# Patient Record
Sex: Female | Born: 1955 | Race: Black or African American | Hispanic: No | Marital: Married
Health system: Southern US, Community
[De-identification: ages and names within clinical notes are randomized; demographics above are authoritative.]

---

## 2004-07-14 ENCOUNTER — Inpatient Hospital Stay: Payer: Self-pay | Admitting: Unknown Physician Specialty

## 2004-11-21 ENCOUNTER — Emergency Department: Payer: Self-pay | Admitting: Emergency Medicine

## 2005-10-18 ENCOUNTER — Other Ambulatory Visit: Payer: Self-pay

## 2005-10-18 ENCOUNTER — Emergency Department: Payer: Self-pay | Admitting: Emergency Medicine

## 2006-10-02 ENCOUNTER — Ambulatory Visit: Payer: Self-pay | Admitting: Cardiovascular Disease

## 2007-08-04 ENCOUNTER — Emergency Department: Payer: Self-pay | Admitting: Unknown Physician Specialty

## 2007-08-10 ENCOUNTER — Ambulatory Visit: Payer: Self-pay | Admitting: Family Medicine

## 2007-08-20 ENCOUNTER — Ambulatory Visit: Payer: Self-pay | Admitting: Family Medicine

## 2007-09-26 ENCOUNTER — Ambulatory Visit: Payer: Self-pay | Admitting: Internal Medicine

## 2007-09-27 ENCOUNTER — Emergency Department: Payer: Self-pay | Admitting: Emergency Medicine

## 2007-11-19 ENCOUNTER — Ambulatory Visit: Payer: Self-pay | Admitting: Gastroenterology

## 2007-12-18 ENCOUNTER — Ambulatory Visit: Payer: Self-pay | Admitting: Gastroenterology

## 2009-02-08 ENCOUNTER — Ambulatory Visit: Payer: Self-pay | Admitting: Family Medicine

## 2009-11-10 ENCOUNTER — Ambulatory Visit: Payer: Self-pay | Admitting: Family Medicine

## 2010-01-24 ENCOUNTER — Ambulatory Visit: Payer: Self-pay | Admitting: Oncology

## 2010-01-27 ENCOUNTER — Ambulatory Visit: Payer: Self-pay | Admitting: Oncology

## 2010-02-24 ENCOUNTER — Ambulatory Visit: Payer: Self-pay | Admitting: Oncology

## 2011-01-22 ENCOUNTER — Emergency Department: Payer: Self-pay | Admitting: Internal Medicine

## 2011-02-06 ENCOUNTER — Ambulatory Visit: Payer: Self-pay | Admitting: Oncology

## 2011-02-17 LAB — CANCER ANTIGEN 27.29: CA 27.29: 29.3 U/mL (ref 0.0–38.6)

## 2011-02-25 ENCOUNTER — Ambulatory Visit: Payer: Self-pay | Admitting: Oncology

## 2011-03-27 ENCOUNTER — Ambulatory Visit: Payer: Self-pay | Admitting: Oncology

## 2011-06-18 ENCOUNTER — Emergency Department: Payer: Self-pay | Admitting: Emergency Medicine

## 2011-08-29 ENCOUNTER — Ambulatory Visit: Payer: Self-pay | Admitting: Oncology

## 2011-08-29 LAB — CBC CANCER CENTER
Basophil #: 0.2 x10 3/mm — ABNORMAL HIGH (ref 0.0–0.1)
Basophil %: 4 %
Eosinophil #: 0.1 x10 3/mm (ref 0.0–0.7)
HCT: 42.3 % (ref 35.0–47.0)
HGB: 14.3 g/dL (ref 12.0–16.0)
Lymphocyte #: 2 x10 3/mm (ref 1.0–3.6)
Lymphocyte %: 37.1 %
MCHC: 33.7 g/dL (ref 32.0–36.0)
MCV: 94.1 fL (ref 80–100)
Monocyte %: 8.1 %
Neutrophil #: 2.7 x10 3/mm (ref 1.4–6.5)
WBC: 5.4 x10 3/mm (ref 3.6–11.0)

## 2011-08-29 LAB — BASIC METABOLIC PANEL
Anion Gap: 7 (ref 7–16)
Chloride: 103 mmol/L (ref 98–107)
Co2: 31 mmol/L (ref 21–32)
Creatinine: 1.34 mg/dL — ABNORMAL HIGH (ref 0.60–1.30)
EGFR (African American): 53 — ABNORMAL LOW
EGFR (Non-African Amer.): 43 — ABNORMAL LOW
Osmolality: 282 (ref 275–301)
Sodium: 141 mmol/L (ref 136–145)

## 2011-08-30 LAB — URINE IEP, RANDOM

## 2011-08-30 LAB — KAPPA/LAMBDA FREE LIGHT CHAINS (ARMC)

## 2011-08-30 LAB — PROT IMMUNOELECTROPHORES(ARMC)

## 2011-09-25 ENCOUNTER — Ambulatory Visit: Payer: Self-pay | Admitting: Oncology

## 2011-12-14 ENCOUNTER — Ambulatory Visit: Payer: Self-pay

## 2012-01-04 ENCOUNTER — Emergency Department: Payer: Self-pay | Admitting: Emergency Medicine

## 2012-01-04 LAB — BASIC METABOLIC PANEL
Anion Gap: 8 (ref 7–16)
Calcium, Total: 9.7 mg/dL (ref 8.5–10.1)
Co2: 26 mmol/L (ref 21–32)
Creatinine: 1.35 mg/dL — ABNORMAL HIGH (ref 0.60–1.30)
EGFR (African American): 50 — ABNORMAL LOW
EGFR (Non-African Amer.): 43 — ABNORMAL LOW
Glucose: 90 mg/dL (ref 65–99)
Osmolality: 279 (ref 275–301)
Potassium: 4.3 mmol/L (ref 3.5–5.1)
Sodium: 140 mmol/L (ref 136–145)

## 2012-01-06 ENCOUNTER — Ambulatory Visit: Payer: Self-pay | Admitting: Physician Assistant

## 2012-03-08 ENCOUNTER — Ambulatory Visit: Payer: Self-pay | Admitting: Internal Medicine

## 2012-04-15 ENCOUNTER — Ambulatory Visit: Payer: Self-pay

## 2012-05-04 ENCOUNTER — Ambulatory Visit: Payer: Self-pay

## 2012-06-18 IMAGING — CT CT CHEST-ABD-PELV W/ CM
2 of 3 series · 13 of 32 positions shown, 19 images · non-contrast
Comparison: none

REASON FOR EXAM: abn lesions in skull assess for primary
COMMENTS:

[Series 2: soft tissue · axial · 0.80mm/px · z∈[+6,+81]mm · 3 of 53 slices shown (1 of 2)]
[im 8/53  soft-tissue]
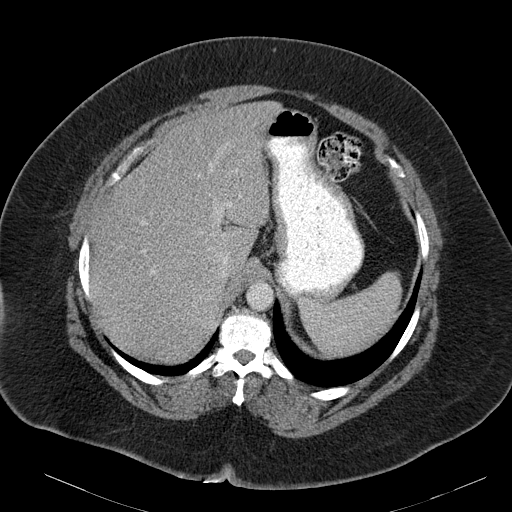
[im 15/53  soft-tissue]
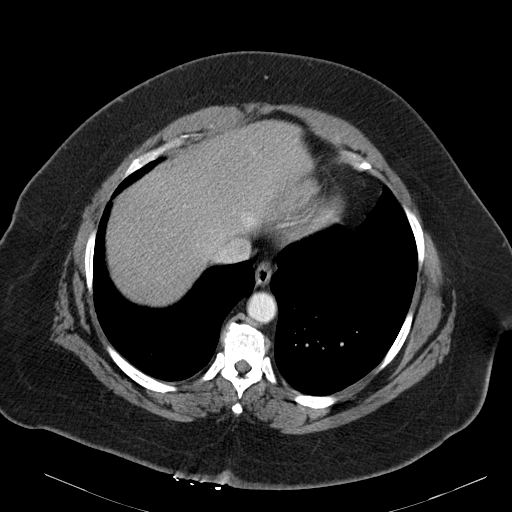
[im 23/53  soft-tissue]
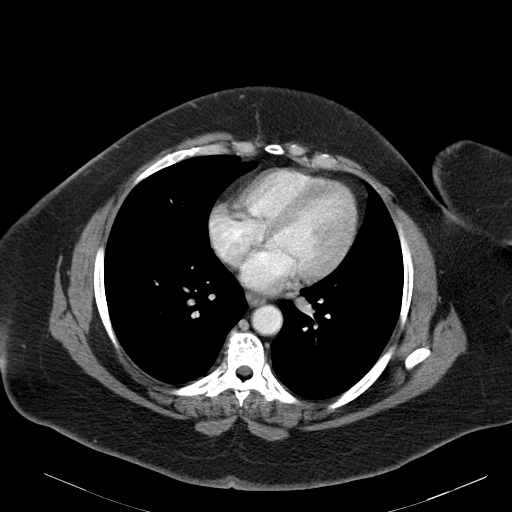

[Series 3: soft tissue · axial · 0.80mm/px · z∈[-314,+41]mm · 10 of 87 slices shown, 16 images (2 of 2)]
[im 8/87  soft-tissue]
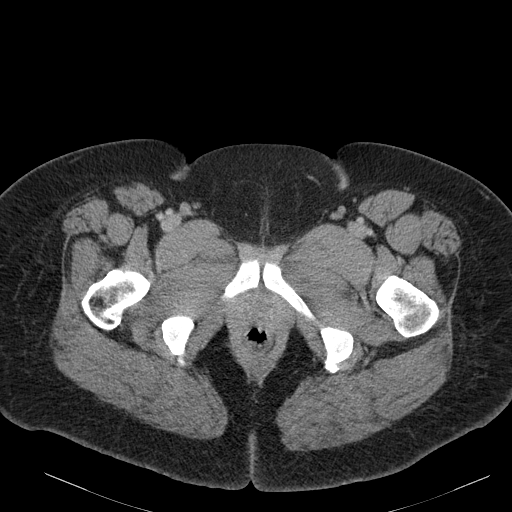
[im 8/87  bone]
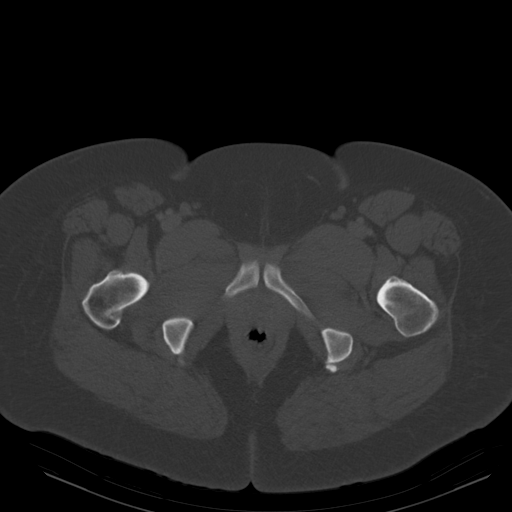
[im 16/87  soft-tissue]
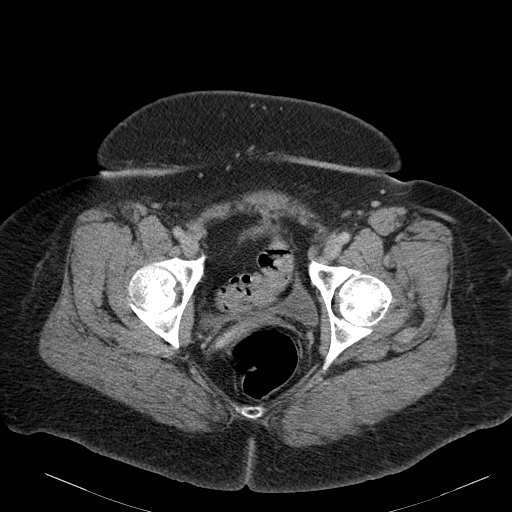
[im 24/87  soft-tissue]
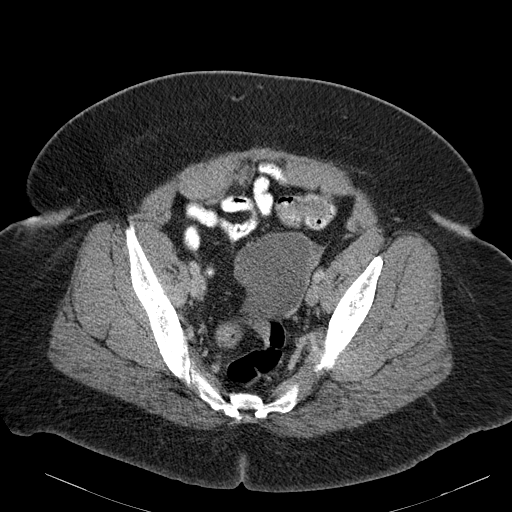
[im 32/87  soft-tissue]
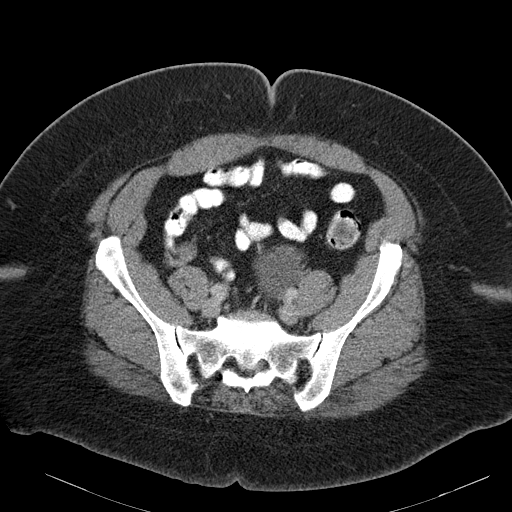
[im 40/87  soft-tissue]
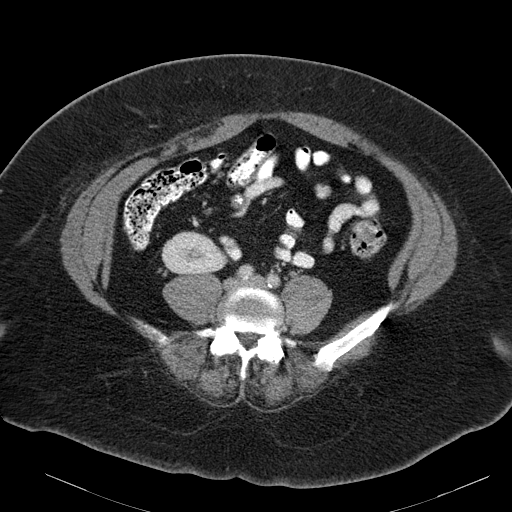
[im 47/87  soft-tissue]
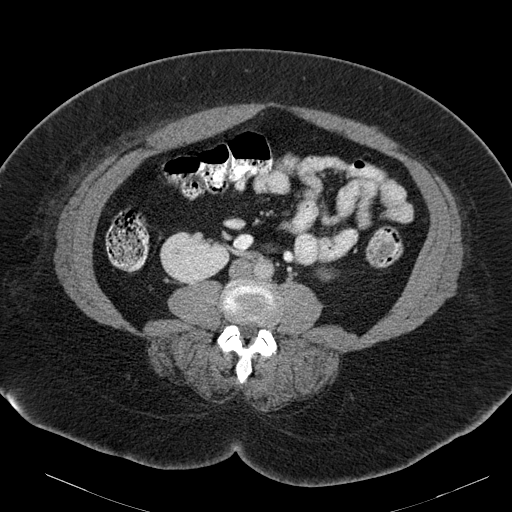
[im 55/87  soft-tissue]
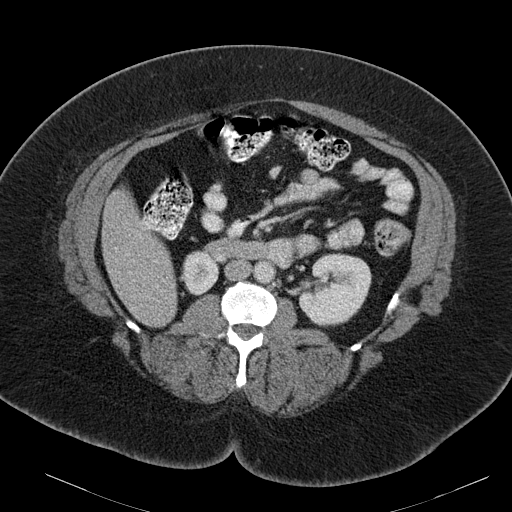
[im 55/87  lung]
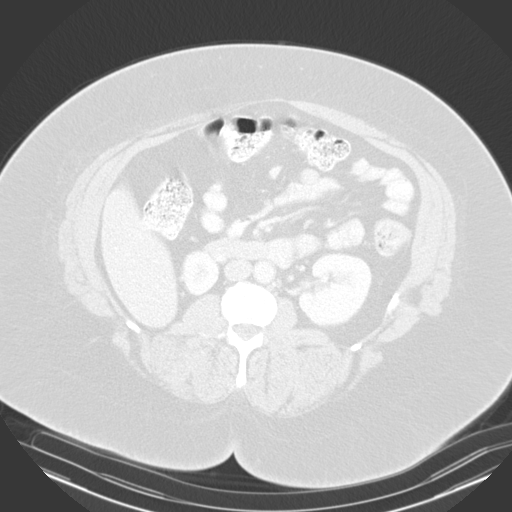
[im 63/87  soft-tissue]
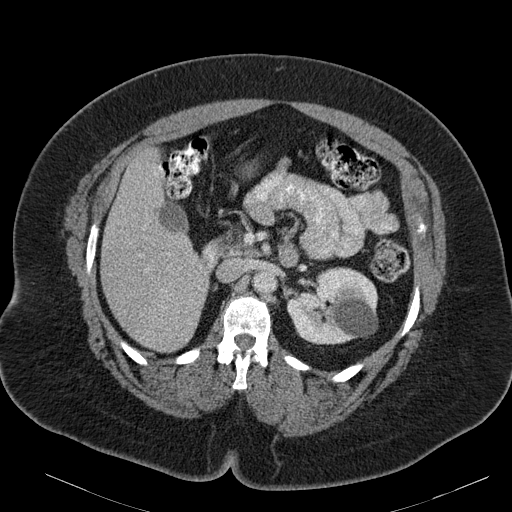
[im 63/87  lung]
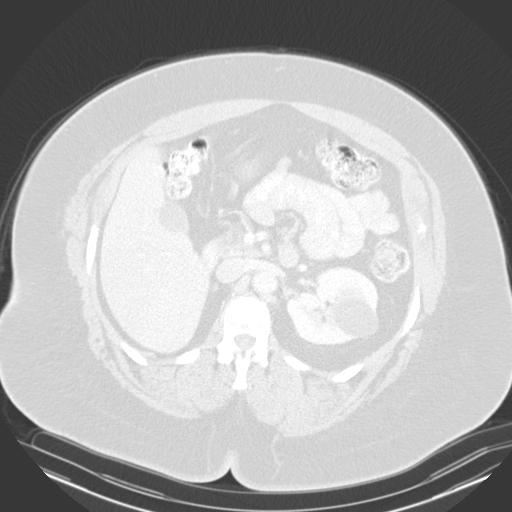
[im 71/87  soft-tissue]
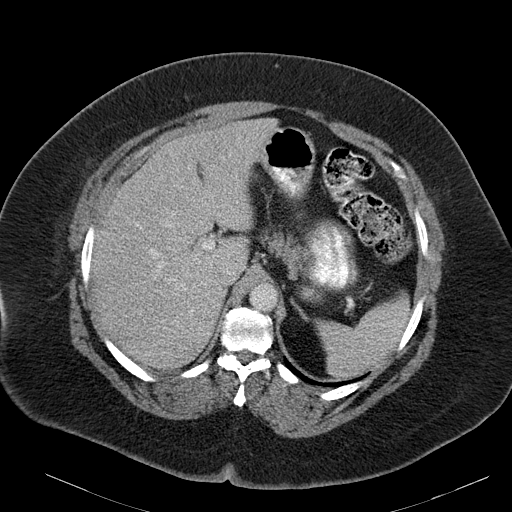
[im 71/87  lung]
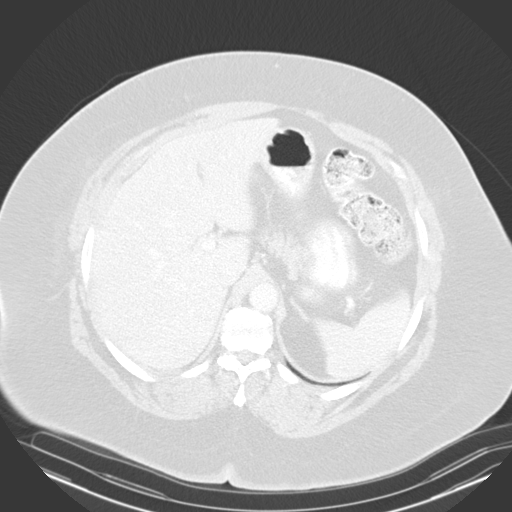
[im 71/87  bone]
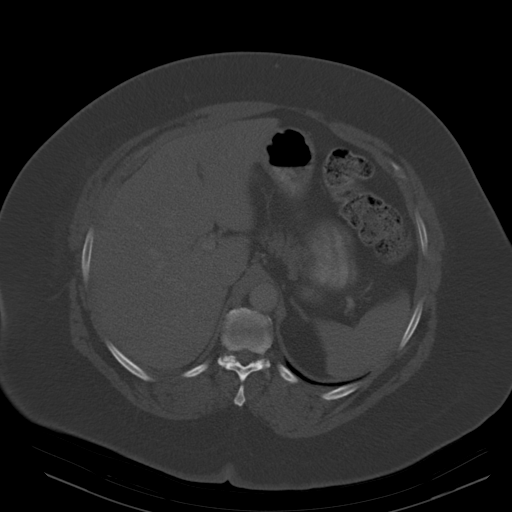
[im 79/87  soft-tissue]
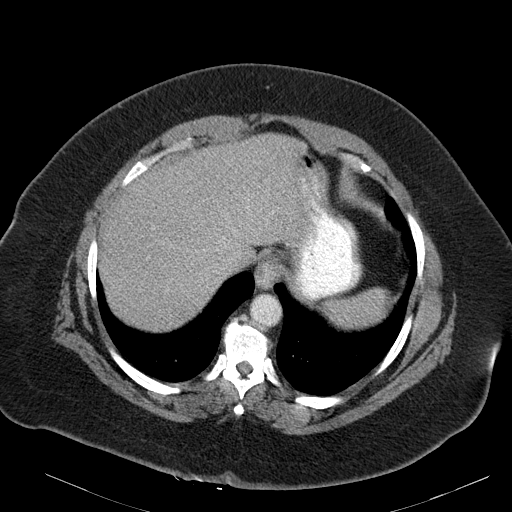
[im 79/87  lung]
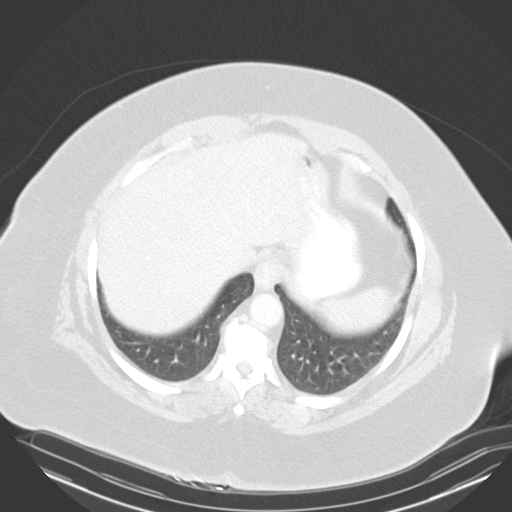

[13 of 32 positions shown; findings below may reference images not displayed]

PROCEDURE:     KEITA - KEITA CHEST ABDOMEN AND PELVIS W  - February 20, 2011 [DATE]

RESULT:     CT of the chest, abdomen and pelvis is performed with 100 mL of
5sovue-JVG iodinated intravenous contrast and oral contrast with images
reconstructed at 5.0 mm slice thickness. There is no previous exam for
comparison.

The lungs are clear. There is no mediastinal or hilar mass or adenopathy. No
pleural or pericardial effusion is evident.

The liver and spleen appear to be within normal limits. There is a
well-circumscribed low-attenuation lesion in the mid left kidney consistent
with a cyst measuring 3.5 cm. The pancreas, adrenal glands, aorta,
gallbladder and GI system appear to be grossly normal. The uterus is present
and unremarkable. No adnexal mass is appreciated. The right kidney is
slightly ptotic. There is no ascites or abnormal fluid collection. The bony
structures appear unremarkable.
IMPRESSION: No acute abnormality evident. No adenopathy or mass.

## 2012-07-05 ENCOUNTER — Ambulatory Visit: Payer: Self-pay | Admitting: Emergency Medicine

## 2012-07-14 ENCOUNTER — Ambulatory Visit: Payer: Self-pay | Admitting: Family Medicine

## 2012-07-14 ENCOUNTER — Inpatient Hospital Stay: Payer: Self-pay | Admitting: Internal Medicine

## 2012-07-14 LAB — CBC
HCT: 45.7 % (ref 35.0–47.0)
HGB: 14.7 g/dL (ref 12.0–16.0)
MCV: 93 fL (ref 80–100)
Platelet: 257 10*3/uL (ref 150–440)
RBC: 4.9 10*6/uL (ref 3.80–5.20)
RDW: 14.4 % (ref 11.5–14.5)
WBC: 7.8 10*3/uL (ref 3.6–11.0)

## 2012-07-14 LAB — COMPREHENSIVE METABOLIC PANEL
Albumin: 3.9 g/dL (ref 3.4–5.0)
Alkaline Phosphatase: 561 U/L — ABNORMAL HIGH (ref 50–136)
Bilirubin,Total: 0.4 mg/dL (ref 0.2–1.0)
Calcium, Total: 9.2 mg/dL (ref 8.5–10.1)
Chloride: 107 mmol/L (ref 98–107)
Co2: 27 mmol/L (ref 21–32)
EGFR (Non-African Amer.): 48 — ABNORMAL LOW
SGOT(AST): 18 U/L (ref 15–37)
SGPT (ALT): 24 U/L (ref 12–78)
Sodium: 141 mmol/L (ref 136–145)
Total Protein: 8 g/dL (ref 6.4–8.2)

## 2012-07-14 LAB — PRO B NATRIURETIC PEPTIDE: B-Type Natriuretic Peptide: 473 pg/mL — ABNORMAL HIGH (ref 0–125)

## 2012-07-14 LAB — CK TOTAL AND CKMB (NOT AT ARMC)
CK, Total: 107 U/L (ref 21–215)
CK-MB: 2.2 ng/mL (ref 0.5–3.6)

## 2012-07-14 LAB — RAPID INFLUENZA A&B ANTIGENS

## 2012-07-15 LAB — CBC WITH DIFFERENTIAL/PLATELET
Eosinophil #: 0 10*3/uL (ref 0.0–0.7)
HCT: 46.7 % (ref 35.0–47.0)
HGB: 15.5 g/dL (ref 12.0–16.0)
Lymphocyte #: 1 10*3/uL (ref 1.0–3.6)
MCV: 93 fL (ref 80–100)
Monocyte %: 4.7 %
Neutrophil #: 5.4 10*3/uL (ref 1.4–6.5)
Platelet: 269 10*3/uL (ref 150–440)
WBC: 6.8 10*3/uL (ref 3.6–11.0)

## 2012-07-15 LAB — BASIC METABOLIC PANEL
Anion Gap: 6 — ABNORMAL LOW (ref 7–16)
BUN: 22 mg/dL — ABNORMAL HIGH (ref 7–18)
Calcium, Total: 9.3 mg/dL (ref 8.5–10.1)
Co2: 26 mmol/L (ref 21–32)
Creatinine: 1.17 mg/dL (ref 0.60–1.30)
EGFR (African American): 60 — ABNORMAL LOW
Glucose: 144 mg/dL — ABNORMAL HIGH (ref 65–99)
Potassium: 4.2 mmol/L (ref 3.5–5.1)

## 2013-04-17 ENCOUNTER — Ambulatory Visit: Payer: Self-pay | Admitting: Family Medicine

## 2013-06-24 ENCOUNTER — Ambulatory Visit: Payer: Self-pay | Admitting: Internal Medicine

## 2014-10-16 NOTE — Discharge Summary (Signed)
PATIENT NAME:  Phyllis RainwaterSTRICKLAND, Diksha P MR#:  [id number] DATE OF BIRTH:  1956/05/30  DATE OF ADMISSION:  07/14/2012 DATE OF DISCHARGE:  07/18/2012  PRIMARY CARE PHYSICIAN: Angus PalmsSionne George, MD  DISCHARGE DIAGNOSES: Chronic obstructive pulmonary disease exacerbation, acute respiratory failure, hypertension, dehydration, tobacco abuse, bipolar and schizophrenia.   CONDITION: Stable.   CODE STATUS: Full code.   HOME MEDICATIONS: Lasix 40 mg p.o. daily, quinapril 20 mg p.o. 2 tablets a day, Zocor 20 mg p.o. daily at bedtime, ProAir HFA 90 mcg inhalation 2 puffs every 4 hours p.r.n., aspirin 81 mg p.o. daily, atenolol 100 mg p.o. daily, ziprasidone 60 mg capsule 2 caps in the evening with food, bupropion SR 150 mg per 12 hours p.o. tablet 1 tab once a day, Invega 3 mg p.o. once a day at bedtime, olanzapine 20 mg p.o. at bedtime, Viibryd 40 mg 1 tablet p.o. daily, diazepam 10 mg p.o. t.i.d., prednisone 40 mg p.o. daily for 2 days and then taper, Spiriva 18 mcg inhalation 1 cap once a day.   STOPPED MEDICATION: Naproxen.   DIET: Low sodium diet.   ACTIVITY: As tolerated.   FOLLOWUP CARE: Follow up with PCP within 1 to 2 weeks. The patient needs smoking cessation.   REASON FOR ADMISSION: Shortness of breath.   HOSPITAL COURSE: The patient is a 59 year old African American female with a history of bipolar schizophrenia, hypertension, long-time smoker, who presented to the ED with shortness of breath, nonproductive cough and wheezing for 4 days. She also had some chest tightness. She was given Solu-Medrol and nebulizer and admitted for COPD exacerbation. For detailed history and physical examination, please refer to the admission note dictated by Dr. Jacques NavyAhmadzia. After admission, the patient has been treated with Solu-Medrol, nebulizers, Spiriva and Levaquin. The patient's symptoms have much improved today. She is clinically stable and will be discharged to home today. I discussed the patient's discharge plan with  the patient and the case manager.   TIME SPENT: About 32 minutes.    ____________________________ Shaune PollackQing Averiana Clouatre, MD qc:jm D: 07/18/2012 16:02:43 ET T: 07/18/2012 18:46:31 ET JOB#: [id number]  cc: Shaune PollackQing Karington Zarazua, MD, <Dictator> Shaune PollackQING Osmar Howton MD ELECTRONICALLY SIGNED 07/20/2012 12:29

## 2014-10-16 NOTE — H&P (Signed)
PATIENT NAME:  Phyllis Vazquez, Phyllis Vazquez MR#:  [id number] DATE OF BIRTH:  01/31/1956  DATE OF ADMISSION:  07/14/2012  REFERRING PHYSICIAN: Dr. Governor Rooksebecca Lord PRIMARY CARE PHYSICIAN: Dr. Hessie DienerBender  CHIEF COMPLAINT: Shortness of breath.   HISTORY OF PRESENT ILLNESS: The patient is a 59 year old African American female with a history of bipolar schizophrenia, hypertension, a long-time smoker, who is here for shortness of breath. The patient has been having shortness of breath but nonproductive cough, wheezing for four days. The patient was seen by her PCP and started on prednisone taper and Albuterol as an outpatient on Wednesday. The patient felt somewhat better but has felt worse recently. She has a nonproductive cough. She has had no flu shot. There is no sick contact. She feels some chest tightness without any chest pain. She currently is unable to talk in full sentences and becomes dyspneic and speaks in incomplete sentences. She was given dose of Solu-Medrol and 2 nebulizers.  The patient does not feel significantly better. While I was in the room, her respiratory rate was about 36. Hospitalist services were contacted for further evaluation and management.   PAST MEDICAL HISTORY:  History of anxiety, PTSD, depression, hypertension, schizophrenia, bipolar disorder, hyperlipidemia, gout.    ALLERGIES: AMOXICILLIN, BEXTRA, PERCOCET.   OUTPATIENT MEDICATIONS: Aspirin 81 mg daily, Atenolol 100 mg daily, Budeprion SR 150 mg per 12 hours 1 tab once a day, furosemide 40 mg daily, Invega Extended Release 3 mg 1 tab once a day at bedtime, Naproxen 500 mg 2 times a day as needed, olanzapine 20 mg once a day, prednisone taper, ProAir HFA Vazquez.r.n., quinapril 20 mg 2 tabs once a day, simvastatin 20 mg once a day, Viibryd 40 mg once a day, ziprasidone 120 mg once a day in the evening with food.   SOCIAL HISTORY: A long-time smoker, smokes 1 to 2 packs a day for close to 40 years. No alcohol. No drug use.   FAMILY HISTORY:  Denies.  REVIEW OF SYSTEMS:   CONSTITUTIONAL: No fever or fatigue. Positive for global weakness. No weight changes.  EYES: No blurry vision or double vision.  ENT: No tinnitus. Some rhinorrhea.  RESPIRATORY: Positive for cough, wheezing, shortness of breath and dyspnea on exertion, 3-pillow orthopnea, which is chronic.  CARDIOVASCULAR: No chest pain, no swelling in the legs. History of high blood pressure.  GASTROINTESTINAL: No nausea, vomiting, diarrhea, or abdominal pain.  GENITOURINARY: No dysuria, hematuria, or frequency.  ENDOCRINE: No polyuria or nocturia.  HEMATOLOGIC/LYMPHATIC: No anemia or easy bruising.  SKIN: No new rashes.  MUSCULOSKELETAL: Denies arthritis.  PSYCHIATRIC: History of bipolar, schizophrenia, and anxiety.   PHYSICAL EXAMINATION: VITAL SIGNS: Temperature 97.8, pulse rate was 69, respiratory rate is entered as 95 initially, but that is incorrect; while I was in the room it was 36. Initial blood pressure 182/78, O2 sats are, per chart, 95%, per ER physician 90.  GENERAL: The patient is an obese African American female lying in bed in moderate respiratory distress, tachypneic, unable to speak in full sentences.  HEENT: Normocephalic, atraumatic. Pupils are equal and reactive. Anicteric sclerae. Moist mucous membranes.  NECK: Supple. No thyroid tenderness. No cervical lymphadenopathy.  CARDIOVASCULAR: S1, S2, regular rate and rhythm. No murmurs, rubs, or gallops.  LUNGS: Diffuse wheezing, rhonchi, decreased breath sounds bilaterally. There is audible wheezing without a stethoscope.  ABDOMEN: Soft, nontender, nondistended. Positive bowel sounds in all quadrants.  EXTREMITIES: No significant lower extremity edema.  NEUROLOGICAL: Cranial nerves II at 12 are grossly intact. Strength is  5 out of 5 in all extremities. Sensation is intact to light touch.  PSYCHIATRIC: Awake, alert, oriented x3, pleasant and cooperative, conversant.  SKIN: No obvious rashes.   LABORATORY,  DIAGNOSTIC AND RADIOLOGICAL DATA:  BNP 473, glucose 124, BUN 23, creatinine 1.24, potassium 3.5, sodium 141. LFTs: Alkaline phosphatase 561, otherwise within normal limits. Troponin negative. CK-MB 2.2.  WBC 7.8, hemoglobin 14.7, platelets 257. Rapid flu negative.  X-ray of the chest, PA and lateral, showing mild interstitial opacity. Atypical infection, interstitial lung edema, and interstitial edema are in differential. Small nodular densities overlying the lower thoracic spine on the lateral view, possibly artifact. EKG: Normal sinus rhythm, rate 70, no acute ST elevations or depressions.   ASSESSMENT AND PLAN: We have a 59 year old African American female with history of bipolar schizophrenia, PTSD, anxiety and depression, and long-term tobacco abuse, hypertension, hyperlipidemia who presents with progressive shortness of breath, wheezing, dyspnea on exertion, was a long-time smoker. She is tachypneic currently and unable to speak in full sentences despite having several rounds of nebulizers and IV Solu-Medrol. At this point, we will admit the patient to the hospital for possible COPD flare. The patient has audible wheezing. The patient will be started on IV Solu-Medrol as well as standing nebulizers, and I would also start her on Spiriva. The patient is not on any inhalers at home chronically. The patient would also benefit from PFTs as an outpatient. We would check an ABG to look for CO2 retention and would consider BiPAP. I will start her on Robitussin Vazquez.r.n. for cough. The patient was counseled about her smoking behavior, and she was counseled for 3 minutes. She does not want a patch at this point. We would hold her beta blocker this point and resume all the other blood pressure medications and bipolar and schizophrenia medications. She has a negative influenza lab here. The patient has no fever or significant leukocytosis, and I, at this point, do not think she has a pneumonia. I will start on heparin  for DVT and her clinically.   CODE STATUS:  The patient is FULL CODE.     TOTAL TIME SPENT: 60 minutes.   ____________________________ Krystal Eaton, MD sa:cb D: 07/14/2012 17:11:27 ET T: 07/14/2012 17:38:08 ET JOB#: [id number]  cc: Krystal Eaton, MD, <Dictator>  Krystal Eaton MD ELECTRONICALLY SIGNED 08/12/2012 15:42
